# Patient Record
Sex: Male | Born: 1950 | Race: Black or African American | Hispanic: No | Marital: Single
Health system: Southern US, Community
[De-identification: ages and names within clinical notes are randomized; demographics above are authoritative.]

## PROBLEM LIST (undated history)

## (undated) DIAGNOSIS — I10 Essential (primary) hypertension: Secondary | ICD-10-CM

## (undated) HISTORY — DX: Essential (primary) hypertension: I10

---

## 2010-07-18 ENCOUNTER — Emergency Department: Payer: Self-pay | Admitting: Emergency Medicine

## 2010-07-24 ENCOUNTER — Emergency Department: Payer: Self-pay | Admitting: Emergency Medicine
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
## 2019-11-16 ENCOUNTER — Encounter: Payer: Self-pay | Admitting: Emergency Medicine

## 2019-11-16 ENCOUNTER — Emergency Department
Admission: EM | Admit: 2019-11-16 | Discharge: 2019-11-16 | Disposition: A | Discharge status: Home / Self Care | Payer: 59 | Attending: Emergency Medicine | Admitting: Emergency Medicine

## 2019-11-16 ENCOUNTER — Emergency Department: Payer: 59

## 2019-11-16 ENCOUNTER — Other Ambulatory Visit: Payer: Self-pay

## 2019-11-16 DIAGNOSIS — M545 Low back pain, unspecified: Secondary | ICD-10-CM

## 2019-11-16 MED ORDER — BACLOFEN 5 MG PO TABS: 5.0000 mg | ORAL_TABLET | Freq: Three times a day (TID) | ORAL | 0 refills | Status: AC | PRN

## 2019-11-16 MED ORDER — LIDOCAINE 5 % EX PTCH: 1.0000 | MEDICATED_PATCH | Freq: Two times a day (BID) | CUTANEOUS | 0 refills | Status: AC

## 2019-11-16 MED ORDER — TRAMADOL HCL 50 MG PO TABS
50.0000 mg | ORAL_TABLET | Freq: Once | ORAL | Status: AC
  Administered 2019-11-16: 50 mg via ORAL
  Filled 2019-11-16: qty 1

## 2019-11-16 MED ORDER — MELOXICAM 15 MG PO TABS: 15.0000 mg | ORAL_TABLET | Freq: Every day | ORAL | 0 refills | Status: AC

## 2019-11-16 MED ORDER — LIDOCAINE 5 % EX PTCH
1.0000 | MEDICATED_PATCH | CUTANEOUS | Status: DC
  Administered 2019-11-16: 1 via TRANSDERMAL
  Filled 2019-11-16: qty 1

## 2019-11-16 NOTE — ED Provider Notes (Signed)
[facility] Emergency Department Provider Note  ____________________________________________  Time seen: Approximately 3:47 PM  I have reviewed the triage vital signs and the nursing notes.   HISTORY  Chief Complaint Back Pain    HPI Gary Abbott is a 69 y.o. male that presents to the emergency department for evaluation of low back pain after injury 3 days ago.  Patient had ordered food at [employer], he went to sit down in a booth and the booth collapsed.  His back was sore that night following the incident.  Yesterday, pain started to radiate down the back of his left leg.  He told the restaurant that he would likely have to see a doctor for the pain.  He was planning to come to the emergency department over the weekend but was unable to get a ride from his daughter.  He did not hit his head or lose consciousness.  He is not on any blood thinners.  He took 800 mg of ibuprofen, without relief.  No additional injuries.  No bowel or bladder dysfunction or saddle anesthesias.  No weakness, numbness, tingling.   History reviewed. No pertinent past medical history.  There are no problems to display for this patient.   History reviewed. No pertinent surgical history.  Prior to Admission medications   Medication Sig Start Date End Date Taking? Authorizing Provider  Baclofen 5 MG TABS Take 5 mg by mouth 3 (three) times daily as needed for up to 3 days. 11/16/19 11/19/19  Enid Derry, PA-C  lidocaine (LIDODERM) 5 % Place 1 patch onto the skin every 12 (twelve) hours. Remove & Discard patch within 12 hours or as directed by MD 11/16/19 11/15/20  Enid Derry, PA-C  meloxicam (MOBIC) 15 MG tablet Take 1 tablet (15 mg total) by mouth daily for 5 days. 11/16/19 11/21/19  Enid Derry, PA-C    Allergies Patient has no allergy information on record.  No family history on file.  Social History Social History   Tobacco Use  . Smoking status: Not on file  Substance  Use Topics  . Alcohol use: Not on file  . Drug use: Not on file     Review of Systems  Cardiovascular: No chest pain. Respiratory: No SOB. Gastrointestinal: No abdominal pain.  No nausea, no vomiting.  Musculoskeletal: Positive for back pain. Skin: Negative for rash, abrasions, lacerations, ecchymosis. Neurological: Negative for headaches, numbness or tingling   ____________________________________________   PHYSICAL EXAM:  VITAL SIGNS: ED Triage Vitals  Enc Vitals Group     BP 11/16/19 1129 (!) 180/87     Pulse Rate 11/16/19 1129 67     Resp 11/16/19 1129 17     Temp 11/16/19 1129 98.6 F (37 C)     Temp Source 11/16/19 1129 Oral     SpO2 11/16/19 1129 96 %     Weight 11/16/19 1125 204 lb (92.5 kg)     Height 11/16/19 1125 5\' 9"  (1.753 m)     Head Circumference --      Peak Flow --      Pain Score 11/16/19 1125 8     Pain Loc --      Pain Edu? --      Excl. in GC? --      Constitutional: Alert and oriented. Well appearing and in no acute distress. Eyes: Conjunctivae are normal. PERRL. EOMI. Head: Atraumatic. ENT:      Ears:      Nose: No congestion/rhinnorhea.  Mouth/Throat: Mucous membranes are moist.  Neck: No stridor. No cervical spine tenderness to palpation. Cardiovascular: Normal rate, regular rhythm.  Good peripheral circulation. Respiratory: Normal respiratory effort without tachypnea or retractions. Lungs CTAB. Good air entry to the bases with no decreased or absent breath sounds. Gastrointestinal: Bowel sounds 4 quadrants. Soft and nontender to palpation. No guarding or rigidity. No palpable masses. No distention.  Musculoskeletal: Full range of motion to all extremities. No gross deformities appreciated.  Minimal tenderness to palpation throughout lumbar spine and lumbar paraspinal muscles.  Strength equal in lower extremities bilaterally.  Normal gait. Neurologic:  Normal speech and language. No gross focal neurologic deficits are appreciated.   Skin:  Skin is warm, dry and intact. No rash noted. Psychiatric: Mood and affect are normal. Speech and behavior are normal. Patient exhibits appropriate insight and judgement.   ____________________________________________   LABS (all labs ordered are listed, but only abnormal results are displayed)  Labs Reviewed - No data to display ____________________________________________  EKG   ____________________________________________  RADIOLOGY Gary Abbott, personally viewed and evaluated these images (plain radiographs) as part of my medical decision making, as well as reviewing the written report by the radiologist.  DG Lumbar Spine 2-3 Views  Result Date: 11/16/2019 CLINICAL DATA:  Back pain EXAM: LUMBAR SPINE - 2-3 VIEW COMPARISON:  None. FINDINGS: There is no evidence of lumbar spine fracture. Alignment is normal. 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 There is partial lumbarization L5 on the left. IMPRESSION: No acute osseous injury. Electronically Signed   By: Romona Curls M.D.   On: 11/16/2019 15:49    ____________________________________________    PROCEDURES  Procedure(s) performed:    Procedures    Medications  lidocaine (LIDODERM) 5 % 1 patch (1 patch Transdermal Patch Applied 11/16/19 1521)  traMADol (ULTRAM) tablet 50 mg (50 mg Oral Given 11/16/19 1521)     ____________________________________________   INITIAL IMPRESSION / ASSESSMENT AND PLAN / ED COURSE  Pertinent labs & imaging results that were available during my care of the patient were reviewed by me and considered in my medical decision making (see chart for details).  Review of the [place] CSRS was performed in accordance of the [facility] prior to dispensing any controlled drugs.    Patient presented to emergency department for evaluation of low back pain after a fall.  Vital  signs and exam are reassuring.  X-ray negative for acute bony abnormalities.  Pain improved with Toradol and Lidoderm patch.  Patient declines a walker to use at home.  Patient will be discharged home with prescriptions for baclofen, Mobic, Lidoderm patch. Patient is to follow up with primary care as directed. Patient is given ED precautions to return to the ED for any worsening or new symptoms.  Gary Abbott was evaluated in Emergency Department on 11/16/2019 for the symptoms described in the history of present illness. He was evaluated in the context of the global COVID-19 pandemic, which necessitated consideration that the patient might be at risk for infection with the SARS-CoV-2 virus that causes COVID-19. Institutional protocols and algorithms that pertain to the evaluation of patients at risk for COVID-19 are in a state of rapid change based on information released by regulatory bodies including the CDC and federal and state organizations. These policies and algorithms were followed during the patient's care in the ED.   ____________________________________________  FINAL CLINICAL IMPRESSION(S) /  ED DIAGNOSES  Final diagnoses:  Acute bilateral low back pain, unspecified whether sciatica present      NEW MEDICATIONS STARTED DURING THIS VISIT:  ED Discharge Orders         Ordered    Baclofen 5 MG TABS  3 times daily PRN        11/16/19 1631    meloxicam (MOBIC) 15 MG tablet  Daily        11/16/19 1631    lidocaine (LIDODERM) 5 %  Every 12 hours        11/16/19 1631              This chart was dictated using voice recognition software/Dragon. Despite best efforts to proofread, errors can occur which can change the meaning. Any change was purely unintentional.    Enid Derry, PA-C 11/16/19 1840    Arnaldo Natal, MD 11/17/19 [phone]

## 2019-11-16 NOTE — ED Notes (Signed)
Pt presents to the ED for lower back pain since Friday. States he was at [employer] and when he sat down at the booth the booth broken down and it hurt his lower back. Pt is A&Ox4 and NAD. VSS. Denies any other complaints at this time.
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
## 2020-02-04 ENCOUNTER — Other Ambulatory Visit: Payer: Self-pay | Admitting: Family Medicine

## 2020-02-04 DIAGNOSIS — Z136 Encounter for screening for cardiovascular disorders: Secondary | ICD-10-CM

## 2020-02-16 ENCOUNTER — Other Ambulatory Visit: Payer: Self-pay

## 2020-02-16 ENCOUNTER — Ambulatory Visit
Admission: RE | Admit: 2020-02-16 | Discharge: 2020-02-16 | Disposition: A | Discharge status: Home / Self Care | Payer: 59 | Source: Ambulatory Visit | Attending: Family Medicine | Admitting: Family Medicine

## 2020-02-16 DIAGNOSIS — Z136 Encounter for screening for cardiovascular disorders: Secondary | ICD-10-CM | POA: Insufficient documentation

## 2021-08-16 IMAGING — US US ABDOMINAL AORTA SCREENING AAA
1 series · 14 of 22 positions shown · non-contrast
Comparison: None.

CLINICAL DATA: Male between 65-75 years of age with a smoking
history.

EXAM:
US ABDOMINAL AORTA MEDICARE SCREENING
TECHNIQUE: Ultrasound examination of the abdominal aorta was performed as a
screening evaluation for abdominal aortic aneurysm.

[Series 1: us abdominal aorta screening aaa · 0.26mm/px · 14 of 22 slices shown]
[im 1/22]
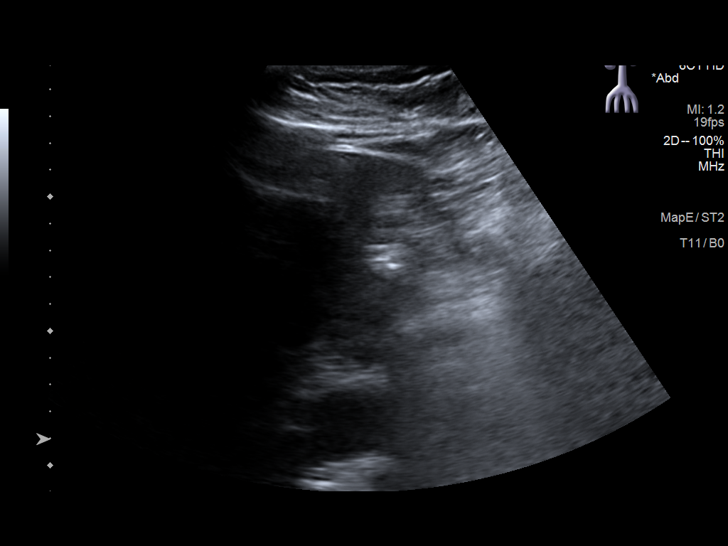
[im 3/22]
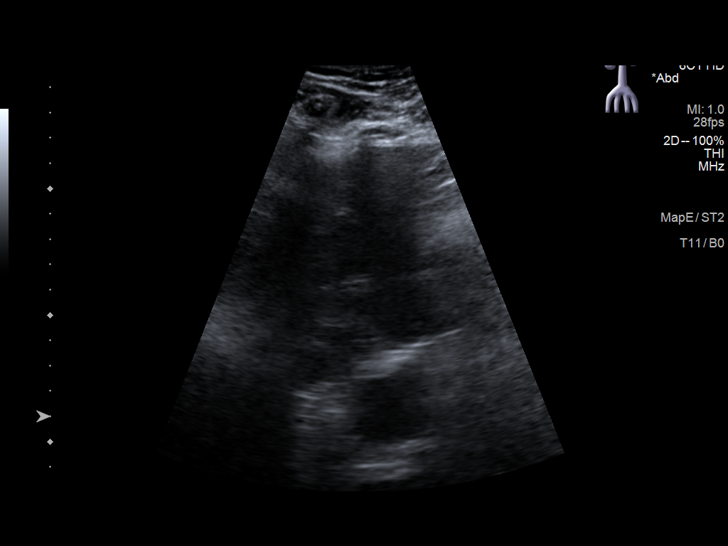
[im 4/22]
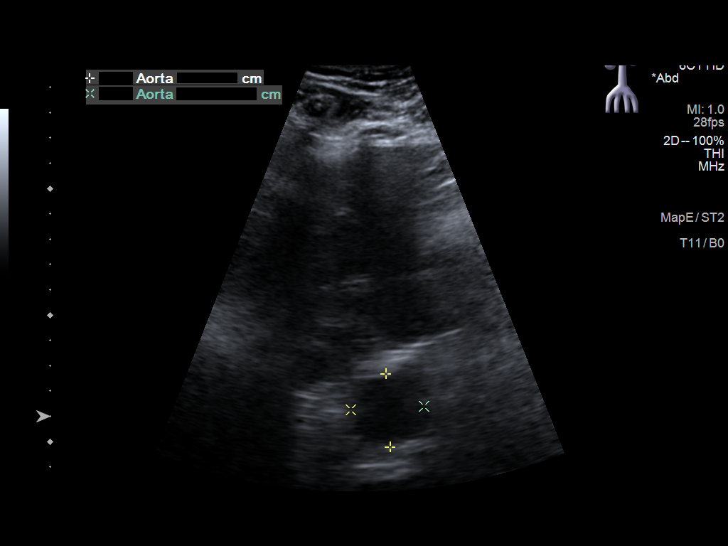
[im 6/22]
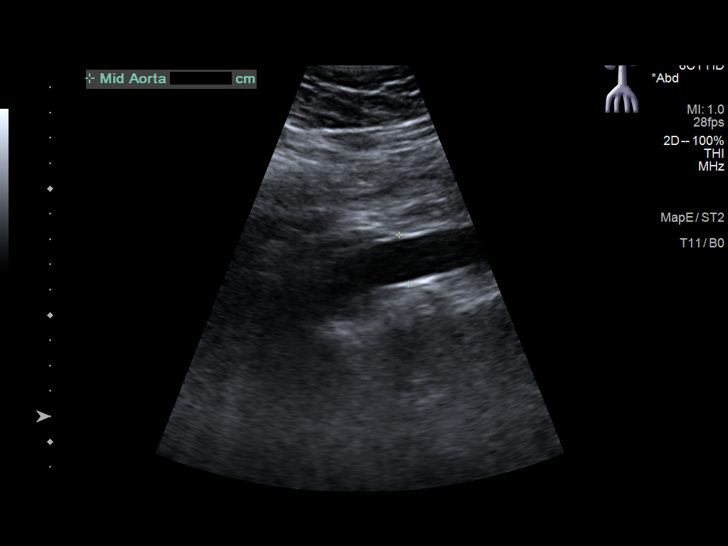
[im 8/22]
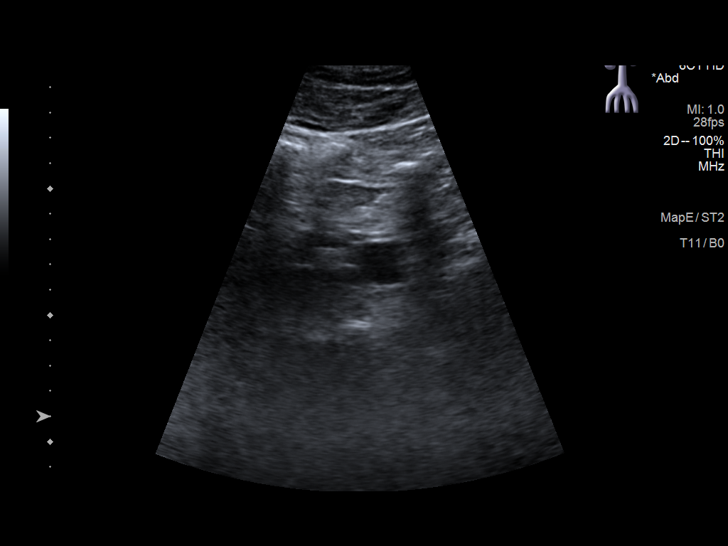
[im 9/22]
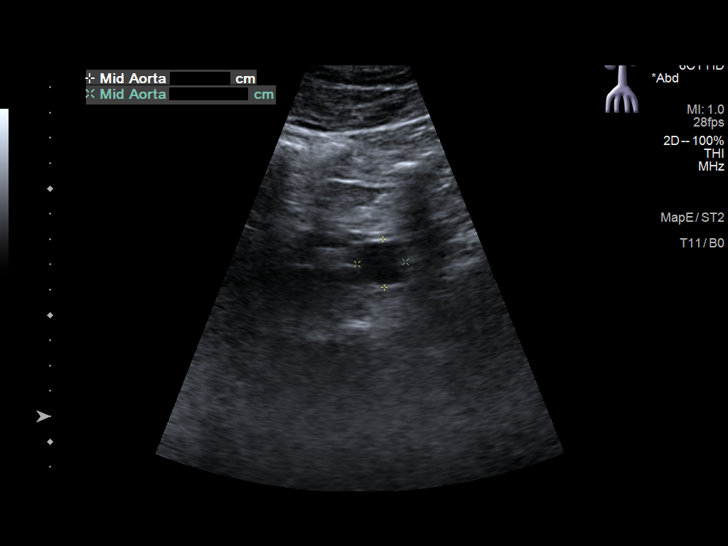
[im 11/22]
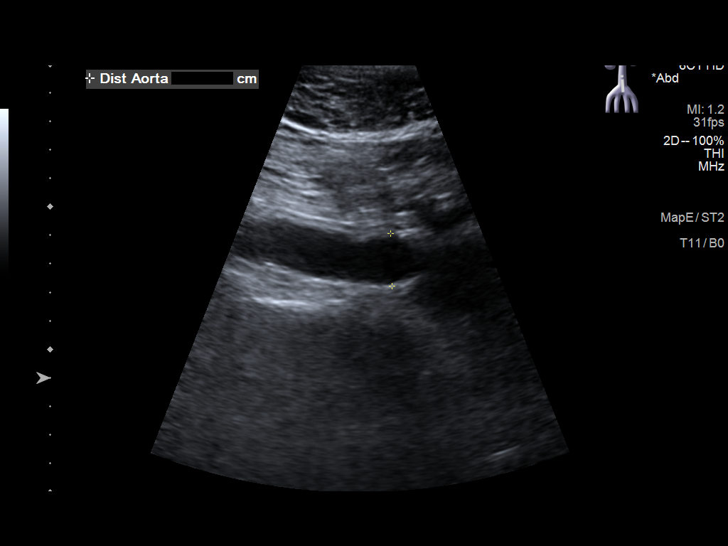
[im 12/22]
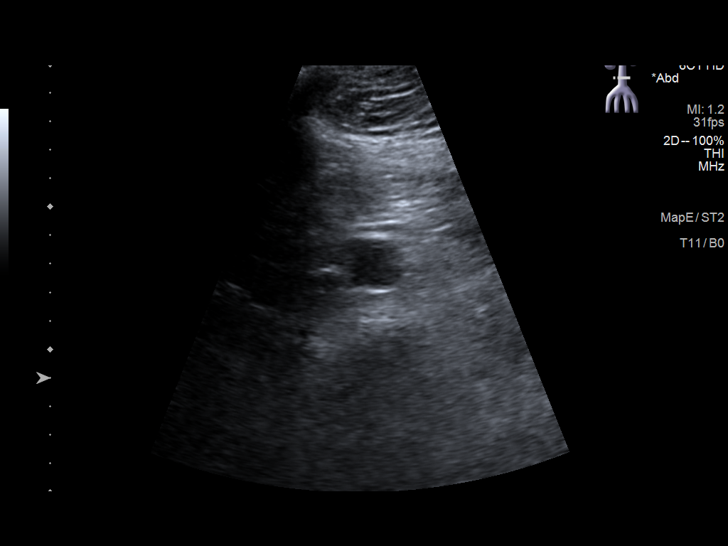
[im 14/22]
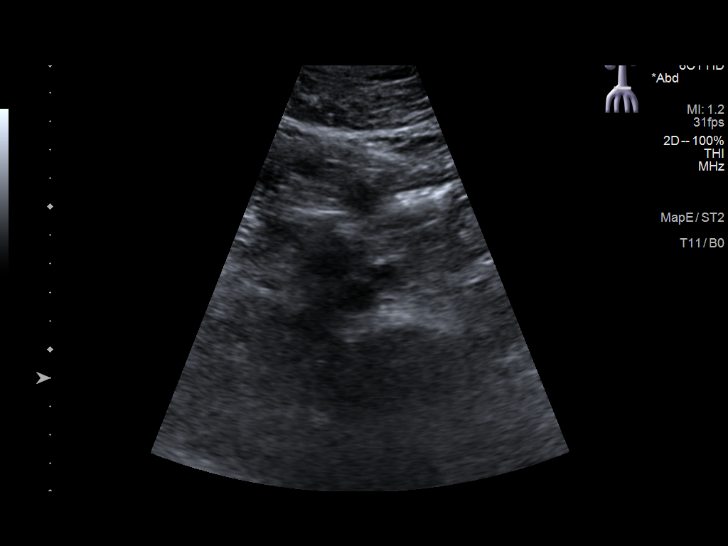
[im 15/22]
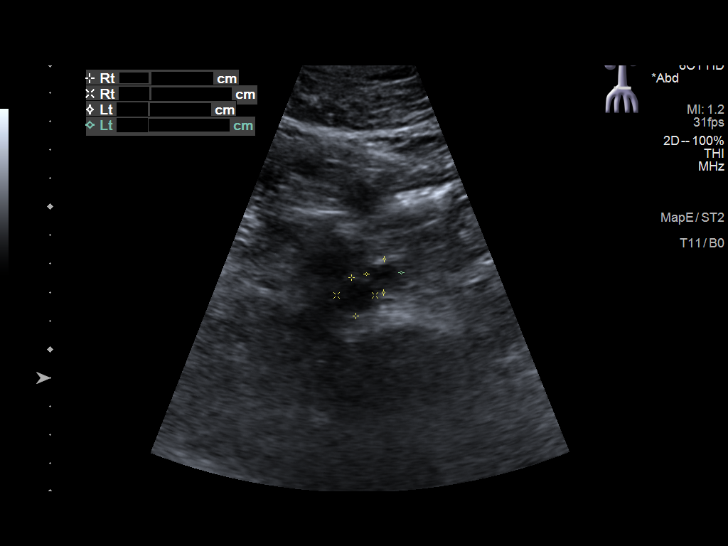
[im 17/22]
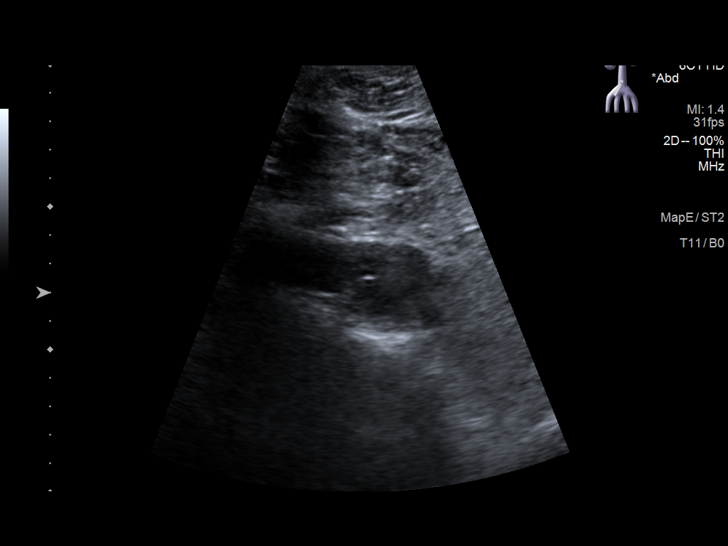
[im 19/22]
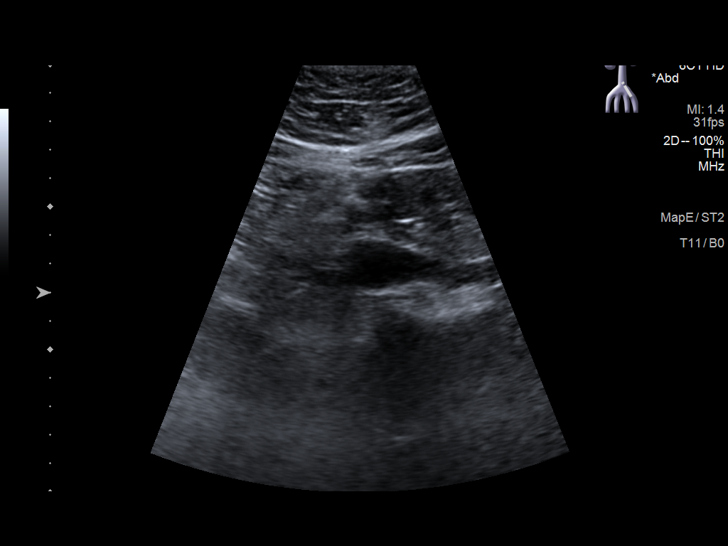
[im 20/22]
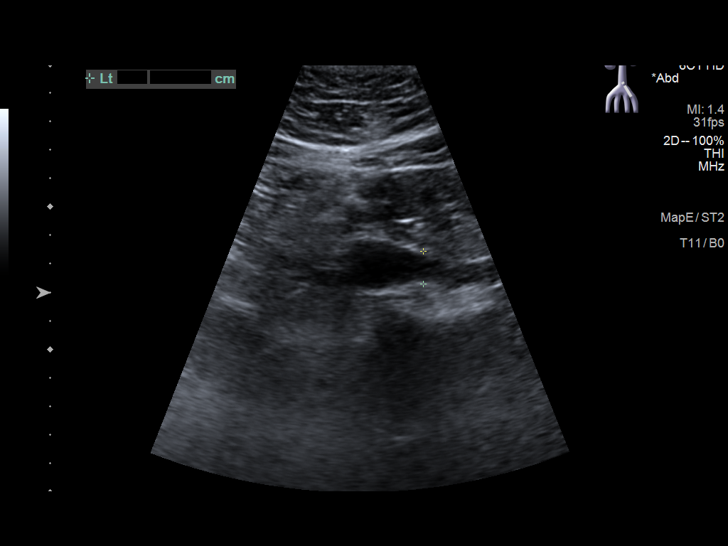
[im 22/22]
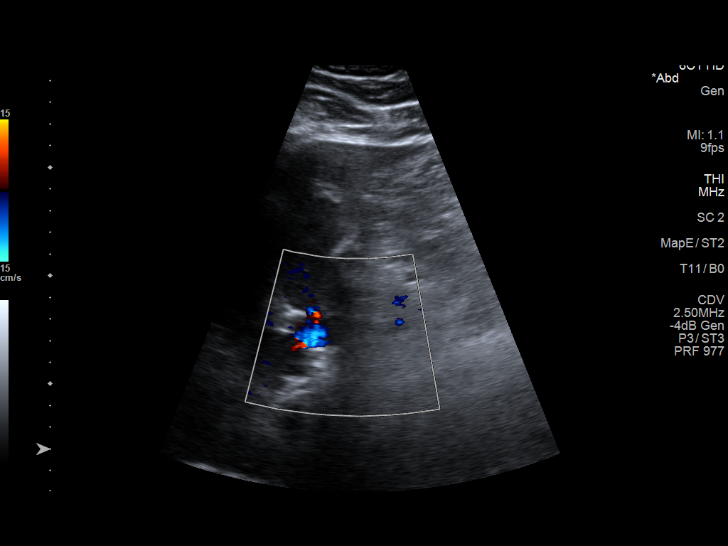

[14 of 22 positions shown; findings below may reference images not displayed]

FINDINGS: Abdominal aortic measurements as follows:

Proximal:  2.9 cm

Mid:  1.9 cm

Distal:  1.9 cm
IMPRESSION: No evidence of abdominal aortic aneurysm.

## 2022-06-08 ENCOUNTER — Emergency Department: Payer: 59

## 2022-06-08 ENCOUNTER — Emergency Department
Admission: EM | Admit: 2022-06-08 | Discharge: 2022-06-08 | Disposition: A | Discharge status: Home / Self Care | Payer: 59 | Attending: Emergency Medicine | Admitting: Emergency Medicine

## 2022-06-08 ENCOUNTER — Other Ambulatory Visit: Payer: Self-pay

## 2022-06-08 DIAGNOSIS — I10 Essential (primary) hypertension: Secondary | ICD-10-CM | POA: Diagnosis not present

## 2022-06-08 DIAGNOSIS — R42 Dizziness and giddiness: Secondary | ICD-10-CM | POA: Insufficient documentation

## 2022-06-08 DIAGNOSIS — E119 Type 2 diabetes mellitus without complications: Secondary | ICD-10-CM | POA: Diagnosis not present

## 2022-06-08 DIAGNOSIS — N390 Urinary tract infection, site not specified: Secondary | ICD-10-CM | POA: Insufficient documentation

## 2022-06-08 LAB — CBC WITH DIFFERENTIAL/PLATELET
Abs Immature Granulocytes: 0.05 10*3/uL (ref 0.00–0.07)
Basophils Absolute: 0.1 10*3/uL (ref 0.0–0.1)
Basophils Relative: 1 %
Eosinophils Absolute: 0.4 10*3/uL (ref 0.0–0.5)
Eosinophils Relative: 5 %
HCT: 41.4 % (ref 39.0–52.0)
Hemoglobin: 13.4 g/dL (ref 13.0–17.0)
Immature Granulocytes: 1 %
Lymphocytes Relative: 26 %
Lymphs Abs: 2.1 10*3/uL (ref 0.7–4.0)
MCH: 30.7 pg (ref 26.0–34.0)
MCHC: 32.4 g/dL (ref 30.0–36.0)
MCV: 94.7 fL (ref 80.0–100.0)
Monocytes Absolute: 0.3 10*3/uL (ref 0.1–1.0)
Monocytes Relative: 4 %
Neutro Abs: 5 10*3/uL (ref 1.7–7.7)
Neutrophils Relative %: 63 %
Platelets: 256 10*3/uL (ref 150–400)
RBC: 4.37 MIL/uL (ref 4.22–5.81)
RDW: 12.1 % (ref 11.5–15.5)
WBC: 7.8 10*3/uL (ref 4.0–10.5)
nRBC: 0 % (ref 0.0–0.2)

## 2022-06-08 LAB — COMPREHENSIVE METABOLIC PANEL
ALT: 35 U/L (ref 0–44)
AST: 27 U/L (ref 15–41)
Albumin: 4.3 g/dL (ref 3.5–5.0)
Alkaline Phosphatase: 83 U/L (ref 38–126)
Anion gap: 9 (ref 5–15)
BUN: 15 mg/dL (ref 8–23)
CO2: 28 mmol/L (ref 22–32)
Calcium: 9.8 mg/dL (ref 8.9–10.3)
Chloride: 99 mmol/L (ref 98–111)
Creatinine, Ser: 0.98 mg/dL (ref 0.61–1.24)
GFR, Estimated: >60 mL/min (ref >60)
Glucose, Bld: 181 mg/dL — ABNORMAL HIGH (ref 70–99)
Potassium: 3.6 mmol/L (ref 3.5–5.1)
Sodium: 136 mmol/L (ref 135–145)
Total Bilirubin: 1 mg/dL (ref 0.3–1.2)
Total Protein: 8.2 g/dL — ABNORMAL HIGH (ref 6.5–8.1)

## 2022-06-08 LAB — URINALYSIS, ROUTINE W REFLEX MICROSCOPIC
Bilirubin Urine: NEGATIVE
Glucose, UA: 50 mg/dL — AB
Hgb urine dipstick: NEGATIVE
Ketones, ur: 5 mg/dL — AB
Nitrite: POSITIVE — AB
Protein, ur: 30 mg/dL — AB
Specific Gravity, Urine: 1.017 (ref 1.005–1.030)
pH: 7 (ref 5.0–8.0)

## 2022-06-08 LAB — TROPONIN I (HIGH SENSITIVITY)
Troponin I (High Sensitivity): 6 ng/L (ref <18)
Troponin I (High Sensitivity): 8 ng/L (ref <18)

## 2022-06-08 MED ORDER — CEPHALEXIN 500 MG PO CAPS: 500.0000 mg | ORAL_CAPSULE | Freq: Two times a day (BID) | ORAL | 0 refills | Status: AC

## 2022-06-08 MED ORDER — SODIUM CHLORIDE 0.9 % IV BOLUS
500.0000 mL | Freq: Once | INTRAVENOUS | Status: AC
  Administered 2022-06-08: 500 mL via INTRAVENOUS

## 2022-06-08 MED ORDER — SODIUM CHLORIDE 0.9 % IV SOLN
1.0000 g | Freq: Once | INTRAVENOUS | Status: AC
  Administered 2022-06-08: 1 g via INTRAVENOUS
  Filled 2022-06-08: qty 10

## 2022-06-08 MED ORDER — MECLIZINE HCL 25 MG PO TABS
25.0000 mg | ORAL_TABLET | Freq: Once | ORAL | Status: AC
  Administered 2022-06-08: 25 mg via ORAL
  Filled 2022-06-08: qty 1

## 2022-06-08 NOTE — Discharge Instructions (Addendum)
Take the antibiotic as prescribed and finish the full 1 week course.  We suspect that your episode of dizziness today was due to a urinary tract infection.  This should resolve with antibiotic.  Make sure you are drinking plenty of fluids.  Return to the ER for new, worsening, or persistent severe dizziness or lightheadedness, nausea or vomiting, abdominal or back pain, difficulty urinating, fever or chills, or any other new or worsening symptoms that concern you.

## 2022-06-08 NOTE — ED Provider Notes (Signed)
[facility] Provider Note    Event Date/Time   First MD Initiated Contact with Patient 06/08/22 1339     (approximate)   History   Dizziness   HPI  Gary Abbott is a 72 y.o. male with a history of diabetes, hypertension, and hyperlipidemia who presents with acute onset of dizziness this morning.  The patient states that he got up and drove his daughter to work early this morning.  He came home around 6 AM, went back to bed, and a short time later got up to use the bathroom.  He states he stood up quickly and suddenly felt very dizzy.  He felt like things were moving around him but also felt lightheaded.  He was nauseous at the same time.  He sat back down for over 30 minutes but the symptoms did not improve.  He states he feels slightly better now but he still feels quite dizzy whenever he lifts his head or tries to turn.  He denies any hearing loss or significant ringing.  He has no chest pain, difficulty breathing, fever or chills, vomiting or diarrhea, or any headache.  He states that he has had 2 prior episodes when he had a relatively sudden near syncope or syncope, but denies a history of vertigo.  I reviewed the past medical records.  The patient was most recently seen by family medicine on 2/12 for an annual physical.  He had no acute issues at that time.   Physical Exam   Triage Vital Signs: ED Triage Vitals  Enc Vitals Group     BP 06/08/22 1332 (!) 142/82     Pulse Rate 06/08/22 1332 (!) 53     Resp 06/08/22 1332 14     Temp --      Temp src --      SpO2 06/08/22 1332 96 %     Weight 06/08/22 1333 215 lb (97.5 kg)     Height 06/08/22 1333 5\' 9"  (1.753 m)     Head Circumference --      Peak Flow --      Pain Score --      Pain Loc --      Pain Edu? --      Excl. in GC? --     Most recent vital signs: Vitals:   06/08/22 1332 06/08/22 1530  BP: (!) 142/82 (!) 158/87  Pulse: (!) 53 (!) 47  Resp: 14 13  SpO2: 96% 99%      General: Alert and oriented, somewhat weak appearing but in no distress.  CV:  Good peripheral perfusion.  Resp:  Normal effort.  Abd:  No distention.  Other:  EOMI.  PERRLA.  No facial droop.  Motor and sensory intact in all extremities.  No pronator drift.  No ataxia on finger-to-nose.  Bilateral TMs and ear canals appear normal.   ED Results / Procedures / Treatments   Labs (all labs ordered are listed, but only abnormal results are displayed) Labs Reviewed  COMPREHENSIVE METABOLIC PANEL - Abnormal; Notable for the following components:      Result Value   Glucose, Bld 181 (*)    Total Protein 8.2 (*)    All other components within normal limits  URINALYSIS, ROUTINE W REFLEX MICROSCOPIC - Abnormal; Notable for the following components:   Color, Urine YELLOW (*)    APPearance HAZY (*)    Glucose, UA 50 (*)    Ketones, ur 5 (*)  Protein, ur 30 (*)    Nitrite POSITIVE (*)    Leukocytes,Ua TRACE (*)    Bacteria, UA MANY (*)    All other components within normal limits  CBC WITH DIFFERENTIAL/PLATELET  TROPONIN I (HIGH SENSITIVITY)     EKG  ED ECG REPORT I, Dionne Bucy, the attending physician, personally viewed and interpreted this ECG.  Date: 06/08/2022 EKG Time: 1336 Rate: 51 Rhythm: normal sinus rhythm QRS Axis: normal Intervals: normal ST/T Wave abnormalities: normal Narrative Interpretation: no evidence of acute ischemia    RADIOLOGY  CT head: I independently viewed and interpreted the images; there is no ICH.  Radiology report indicates no acute abnormality.   PROCEDURES:  Critical Care performed: No  Procedures   MEDICATIONS ORDERED IN ED: Medications  cefTRIAXone (ROCEPHIN) 1 g in sodium chloride 0.9 % 100 mL IVPB (has no administration in time range)  sodium chloride 0.9 % bolus 500 mL (500 mLs Intravenous New Bag/Given 06/08/22 1509)  meclizine (ANTIVERT) tablet 25 mg (25 mg Oral Given 06/08/22 1508)     IMPRESSION / MDM /  ASSESSMENT AND PLAN / ED COURSE  I reviewed the triage vital signs and the nursing notes.  72 year old male with PMH as noted above presents with acute onset of dizziness when he got up quickly out of bed; he describes both elements of vertigo/spinning as well as lightheadedness and feel like he would pass out.  The symptoms have slightly improved now but are still present with lifting his head and turning.  Exam is unremarkable; vital signs are normal.  Thorough neurologic exam is normal.  Differential diagnosis includes, but is not limited to, peripheral vertigo, vasovagal near syncope, orthostatic hypotension, dehydration/hypovolemia, electrolyte abnormality, UTI or other infection, less likely ACS or other cardiac cause.  I have a low sufficient for CVA or TIA given the normal neurologic exam.  We will obtain CT head, lab workup, give fluids and meclizine, and reassess.  Patient's presentation is most consistent with acute presentation with potential threat to life or bodily function.  The patient is on the cardiac monitor to evaluate for evidence of arrhythmia and/or significant heart rate changes.  ----------------------------------------- 3:55 PM on 06/08/2022 -----------------------------------------  CT head shows no acute findings.  Lab workup is unremarkable with negative troponin, no leukocytosis or anemia, but the urinalysis shows findings consistent with active UTI.  The patient does report some recent urinary frequency but denies dysuria.  On reassessment, the patient states he is feeling much better.  He now describes the episode more as a near syncope than vertiginous type dizziness.  I did consider whether he may benefit from admission for more additional fluids and IV antibiotics given the episode today, but he states that he feels well and would prefer to go home.  The patient has no chest pain or EKG changes or any findings concerning for ACS.  There is no indication for  repeat troponin.  Given the normal neurologic exam and resolved symptoms there is also no evidence of TIA or CVA or any indication for MRI or further emergent stroke workup at this time.  I counseled the patient on the results of the workup.  I will give him a dose of ceftriaxone in the ED and have prescribed Keflex for home.  I gave strict return precautions and he expresses understanding.  FINAL CLINICAL IMPRESSION(S) / ED DIAGNOSES   Final diagnoses:  Dizziness  Urinary tract infection without hematuria, site unspecified     Rx / DC Orders  ED Discharge Orders          Ordered    cephALEXin (KEFLEX) 500 MG capsule  2 times daily        06/08/22 1554             Note:  This document was prepared using Dragon voice recognition software and may include unintentional dictation errors.    Dionne BucySiadecki, Estelle Greenleaf, MD 06/08/22 [phone]

## 2022-06-08 NOTE — ED Triage Notes (Signed)
Patient presents to the ER via EMS from home with sudden onset of dizziness and nausea this morning. Patient denies pain.

## 2022-11-24 ENCOUNTER — Emergency Department: Payer: 59

## 2022-11-24 ENCOUNTER — Emergency Department
Admission: EM | Admit: 2022-11-24 | Discharge: 2022-11-25 | Disposition: A | Discharge status: Home / Self Care | Payer: 59 | Attending: Emergency Medicine | Admitting: Emergency Medicine

## 2022-11-24 ENCOUNTER — Other Ambulatory Visit: Payer: Self-pay

## 2022-11-24 DIAGNOSIS — I10 Essential (primary) hypertension: Secondary | ICD-10-CM | POA: Insufficient documentation

## 2022-11-24 DIAGNOSIS — S6991XA Unspecified injury of right wrist, hand and finger(s), initial encounter: Secondary | ICD-10-CM | POA: Diagnosis present

## 2022-11-24 DIAGNOSIS — S61051A Open bite of right thumb without damage to nail, initial encounter: Secondary | ICD-10-CM | POA: Diagnosis not present

## 2022-11-24 DIAGNOSIS — Z23 Encounter for immunization: Secondary | ICD-10-CM | POA: Diagnosis not present

## 2022-11-24 DIAGNOSIS — W540XXA Bitten by dog, initial encounter: Secondary | ICD-10-CM | POA: Insufficient documentation

## 2022-11-24 HISTORY — DX: Essential (primary) hypertension: I10

## 2022-11-24 MED ORDER — ACETAMINOPHEN 325 MG PO TABS
650.0000 mg | ORAL_TABLET | Freq: Once | ORAL | Status: AC
  Administered 2022-11-25: 650 mg via ORAL
  Filled 2022-11-24: qty 2

## 2022-11-24 MED ORDER — AMOXICILLIN-POT CLAVULANATE 875-125 MG PO TABS: 1.0000 | ORAL_TABLET | Freq: Two times a day (BID) | ORAL | 0 refills | Status: AC

## 2022-11-24 MED ORDER — AMOXICILLIN-POT CLAVULANATE 875-125 MG PO TABS
1.0000 | ORAL_TABLET | Freq: Once | ORAL | Status: AC
  Administered 2022-11-25: 1 via ORAL
  Filled 2022-11-24: qty 1

## 2022-11-24 MED ORDER — TETANUS-DIPHTH-ACELL PERTUSSIS 5-2.5-18.5 LF-MCG/0.5 IM SUSY
0.5000 mL | PREFILLED_SYRINGE | Freq: Once | INTRAMUSCULAR | Status: AC
  Administered 2022-11-25: 0.5 mL via INTRAMUSCULAR
  Filled 2022-11-24: qty 0.5

## 2022-11-24 NOTE — Discharge Instructions (Addendum)
You were evaluated in the ED for a dog bite to your right thumb.  Your x-ray shows no fracture or dislocation.  Your tetanus was updated today.  The dog bite wound was cleaned and dressed appropriately.  You are placed in a splint.  You will need to monitor for symptoms such as fever.  Monitor your wound daily.  Clean with only soap and water.  Do not submerge finger in large pools of water.  Daily dressing changes are recommended.  Take Tylenol for pain as needed.  Call and schedule appointment with your primary care for wound check.  Antibiotics as prescribed and instructed on bottle until dose is complete.

## 2022-11-24 NOTE — ED Triage Notes (Signed)
Pt arrives after dog bite to right thumb. Pt reports he was getting his dog out of the kennel and he bit his right thumb. Pt unsure of last tetanus. Dog is UTD on vaccines.

## 2022-11-25 DIAGNOSIS — S61051A Open bite of right thumb without damage to nail, initial encounter: Secondary | ICD-10-CM | POA: Diagnosis not present

## 2022-11-25 NOTE — ED Provider Notes (Signed)
[facility] Emergency Department Provider Note     Event Date/Time   First MD Initiated Contact with Patient 11/24/22 2259     (approximate)   History   Animal Bite   HPI  Gary Abbott is a 72 y.o. male with a history of HTN presents to the ED with complaint of a dog bite to his right thumb.  Patient reports he was putting his dog inside of his crate when the dog reacted and bit him on the finger.  Patient reports dog is up-to-date on vaccines and rabies.  Unknown tetanus status.  Denies numbness and tingling.     Physical Exam   Triage Vital Signs: ED Triage Vitals  Encounter Vitals Group     BP 11/24/22 2200 (!) 145/92     Systolic BP Percentile --      Diastolic BP Percentile --      Pulse Rate 11/24/22 2200 71     Resp 11/24/22 2200 18     Temp 11/24/22 2200 98.2 F (36.8 C)     Temp src --      SpO2 11/24/22 2200 94 %     Weight 11/24/22 2159 200 lb (90.7 kg)     Height 11/24/22 2159 5\' 9"  (1.753 m)     Head Circumference --      Peak Flow --      Pain Score 11/24/22 2158 3     Pain Loc --      Pain Education --      Exclude from Growth Chart --     Most recent vital signs: Vitals:   11/24/22 2200  BP: (!) 145/92  Pulse: 71  Resp: 18  Temp: 98.2 F (36.8 C)  SpO2: 94%    General Awake, no distress.  HEENT NCAT. PERRL. EOMI. No rhinorrhea. Mucous membranes are moist.  CV:  Good peripheral perfusion.  RESP:  Normal effort.  ABD:  No distention.  Other:  Right thumb reveals missing chunk on fat pad and nail on radial side.  No bone exposure.  Neurovascular status intact all throughout.  Full range of motion without difficulty.  capillary refill brisk and normal.  ED Results / Procedures / Treatments   Labs (all labs ordered are listed, but only abnormal results are displayed) Labs Reviewed - No data to display   RADIOLOGY  I personally viewed and evaluated these images as part of my medical decision making, as well  as reviewing the written report by the radiologist.  ED Provider Interpretation: Right thumb x-ray is unremarkable.  DG Finger Thumb Right  Result Date: 11/24/2022 CLINICAL DATA:  Dog bite EXAM: RIGHT THUMB 2+V COMPARISON:  None Available. FINDINGS: A bandage overlies the distal aspect of the thumb. There is no evidence of fracture or dislocation. There are mild degenerative changes of the first interphalangeal joint. Soft tissues are unremarkable. IMPRESSION: No acute fracture or dislocation of the right thumb. Mild degenerative changes of the first interphalangeal joint. Electronically Signed   By: Darliss Cheney M.D.   On: 11/24/2022 22:40    PROCEDURES:  Critical Care performed: No  Procedures  MEDICATIONS ORDERED IN ED: Medications  Tdap (BOOSTRIX) injection 0.5 mL (has no administration in time range)  amoxicillin-clavulanate (AUGMENTIN) 875-125 MG per tablet 1 tablet (has no administration in time range)  acetaminophen (TYLENOL) tablet 650 mg (has no administration in time range)   IMPRESSION / MDM / ASSESSMENT AND PLAN / ED COURSE  I reviewed  the triage vital signs and the nursing notes.                               72 y.o. male presents to the emergency department for evaluation and treatment of dog bite to the right thumb. See HPI for further details.   Differential diagnosis includes, but is not limited to laceration, avulsion, fracture, retained foreign body.  Patient's presentation is most consistent with acute complicated illness / injury requiring diagnostic workup.  Patient is alert and oriented.  He is afebrile and hemodynamically stable.  X-ray obtained in triage of right thumb reveals no fractures.  Wound is extensively cleaned with iodine, chlorhexidine and saline.  Irrigated extensively.  Hemostatic dressing applied and wrapped.  Patient is placed in a finger splint for comfort.  Encouraged daily monitoring of wound and close follow-up with primary care for wound  check.  Augmentin described.  Tetanus updated. Patient is given ED precautions to return to the ED for any worsening or new symptoms. Patient verbalizes understanding. All questions and concerns were addressed during ED visit.     FINAL CLINICAL IMPRESSION(S) / ED DIAGNOSES   Final diagnoses:  Dog bite, initial encounter    Rx / DC Orders   ED Discharge Orders          Ordered    amoxicillin-clavulanate (AUGMENTIN) 875-125 MG tablet  2 times daily        11/24/22 2357            Note:  This document was prepared using Dragon voice recognition software and may include unintentional dictation errors.    Romeo Apple, Kylin Dubs A, PA-C 11/25/22 0021    Janith Lima, MD 11/26/22 1125
